# Patient Record
Sex: Male | Born: 1955 | Hispanic: No | Marital: Married | Smoking: Current every day smoker
Health system: Southern US, Community
[De-identification: ages and names within clinical notes are randomized; demographics above are authoritative.]

---

## 2009-12-05 ENCOUNTER — Emergency Department (HOSPITAL_BASED_OUTPATIENT_CLINIC_OR_DEPARTMENT_OTHER): Admission: EM | Admit: 2009-12-05 | Discharge: 2009-12-05 | Payer: Self-pay | Admitting: Emergency Medicine

## 2012-10-10 ENCOUNTER — Emergency Department (HOSPITAL_BASED_OUTPATIENT_CLINIC_OR_DEPARTMENT_OTHER): Payer: PRIVATE HEALTH INSURANCE

## 2012-10-10 ENCOUNTER — Emergency Department (HOSPITAL_BASED_OUTPATIENT_CLINIC_OR_DEPARTMENT_OTHER)
Admission: EM | Admit: 2012-10-10 | Discharge: 2012-10-10 | Disposition: A | Discharge status: Home / Self Care | Payer: PRIVATE HEALTH INSURANCE | Attending: Emergency Medicine | Admitting: Emergency Medicine

## 2012-10-10 ENCOUNTER — Encounter (HOSPITAL_BASED_OUTPATIENT_CLINIC_OR_DEPARTMENT_OTHER): Payer: Self-pay | Admitting: Emergency Medicine

## 2012-10-10 DIAGNOSIS — M79609 Pain in unspecified limb: Secondary | ICD-10-CM | POA: Insufficient documentation

## 2012-10-10 DIAGNOSIS — R739 Hyperglycemia, unspecified: Secondary | ICD-10-CM

## 2012-10-10 DIAGNOSIS — R7309 Other abnormal glucose: Secondary | ICD-10-CM | POA: Insufficient documentation

## 2012-10-10 DIAGNOSIS — F17200 Nicotine dependence, unspecified, uncomplicated: Secondary | ICD-10-CM | POA: Insufficient documentation

## 2012-10-10 DIAGNOSIS — M7989 Other specified soft tissue disorders: Secondary | ICD-10-CM | POA: Insufficient documentation

## 2012-10-10 LAB — CBC WITH DIFFERENTIAL/PLATELET
Basophils Absolute: 0 10*3/uL (ref 0.0–0.1)
Basophils Relative: 0 % (ref 0–1)
Eosinophils Absolute: 0.2 10*3/uL (ref 0.0–0.7)
Eosinophils Relative: 3 % (ref 0–5)
HCT: 38.5 % — ABNORMAL LOW (ref 39.0–52.0)
MCH: 29.1 pg (ref 26.0–34.0)
MCHC: 33.8 g/dL (ref 30.0–36.0)
MCV: 86.1 fL (ref 78.0–100.0)
Monocytes Absolute: 0.4 10*3/uL (ref 0.1–1.0)
Platelets: 270 10*3/uL (ref 150–400)
RDW: 13 % (ref 11.5–15.5)
WBC: 5.3 10*3/uL (ref 4.0–10.5)

## 2012-10-10 LAB — COMPREHENSIVE METABOLIC PANEL
ALT: 18 U/L (ref 0–53)
AST: 21 U/L (ref 0–37)
CO2: 25 mEq/L (ref 19–32)
Calcium: 9.5 mg/dL (ref 8.4–10.5)
Creatinine, Ser: 0.8 mg/dL (ref 0.50–1.35)
GFR calc non Af Amer: >90 mL/min (ref >90)
Sodium: 138 mEq/L (ref 135–145)
Total Protein: 6.9 g/dL (ref 6.0–8.3)

## 2012-10-10 MED ORDER — NAPROXEN 500 MG PO TABS: 500.0000 mg | ORAL_TABLET | Freq: Two times a day (BID) | ORAL | Status: AC

## 2012-10-10 NOTE — ED Notes (Signed)
MD at bedside. 

## 2012-10-10 NOTE — ED Notes (Signed)
Patient transported to Ultrasound 

## 2012-10-10 NOTE — ED Provider Notes (Signed)
History    This chart was scribed for Dione Booze, MD by Donne Anon, ED Scribe. This patient was seen in room MH11/MH11 and the patient's care was started at 1636.   CSN: [id number]  Arrival date & time 10/10/12  1629   First MD Initiated Contact with Patient 10/10/12 1636      Chief Complaint  Patient presents with  . Leg Pain  . Leg Swelling     The history is provided by the patient and a friend. No language interpreter was used.   HPI Comments: Jose Atkinson is a 57 y.o. male who presents to the Emergency Department complaining of 2 months of gradual onset, gradually worsening, intermittent, moderate left lower leg pain with associated swelling. He denies weigh change, CP, SOB or any other pain.  He does not have a PCP.  History reviewed. No pertinent past medical history.  History reviewed. No pertinent past surgical history.  No family history on file.  History  Substance Use Topics  . Smoking status: Current Every Day Smoker -- 1.00 packs/day  . Smokeless tobacco: Not on file  . Alcohol Use: No      Review of Systems  Constitutional: Negative for unexpected weight change.  Respiratory: Negative for shortness of breath.   Cardiovascular: Positive for leg swelling. Negative for chest pain.  Musculoskeletal: Positive for myalgias.  All other systems reviewed and are negative.    Allergies  Review of patient's allergies indicates no known allergies.  Home Medications  No current outpatient prescriptions on file.  Triage Vitals; BP 121/65  Pulse 74  Temp(Src) 99.3 F (37.4 C) (Oral)  Resp 18  SpO2 97%  Physical Exam  Nursing note and vitals reviewed. Constitutional: He is oriented to person, place, and time. He appears well-developed and well-nourished. No distress.  HENT:  Head: Normocephalic and atraumatic.  Eyes: EOM are normal.  Neck: Neck supple. No tracheal deviation present.  Cardiovascular: Normal rate, regular rhythm, normal heart sounds  and intact distal pulses.   Pulmonary/Chest: Effort normal and breath sounds normal. No respiratory distress. He has no wheezes. He has no rales.  Musculoskeletal: Normal range of motion. He exhibits no edema and no tenderness.  Left lower leg is a 5 cm x 4 cm hyperpigmented raised area on the anterior medial surface of the distal lower leg which is hard to the touch. On the anterior lateral aspect there is a vivacious area which is slightly swollen.No edema. No tenderness. Distal pulses are strong.  Neurological: He is alert and oriented to person, place, and time.  Skin: Skin is warm and dry.  Psychiatric: He has a normal mood and affect. His behavior is normal.    ED Course  Procedures (including critical care time) DIAGNOSTIC STUDIES: Oxygen Saturation is 97% on TA, adequate by my interpretation.    COORDINATION OF CARE: 4:45 PM Discussed treatment plan which includes labs and leg xray with pt at bedside and pt agreed to plan.     Results for orders placed during the hospital encounter of 10/10/12  CBC WITH DIFFERENTIAL      Result Value Range   WBC 5.3  4.0 - 10.5 K/uL   RBC 4.47  4.22 - 5.81 MIL/uL   Hemoglobin 13.0  13.0 - 17.0 g/dL   HCT 86.5 (*) 78.4 - 69.6 %   MCV 86.1  78.0 - 100.0 fL   MCH 29.1  26.0 - 34.0 pg   MCHC 33.8  30.0 - 36.0 g/dL  RDW 13.0  11.5 - 15.5 %   Platelets 270  150 - 400 K/uL   Neutrophils Relative % 54  43 - 77 %   Neutro Abs 2.9  1.7 - 7.7 K/uL   Lymphocytes Relative 36  12 - 46 %   Lymphs Abs 1.9  0.7 - 4.0 K/uL   Monocytes Relative 7  3 - 12 %   Monocytes Absolute 0.4  0.1 - 1.0 K/uL   Eosinophils Relative 3  0 - 5 %   Eosinophils Absolute 0.2  0.0 - 0.7 K/uL   Basophils Relative 0  0 - 1 %   Basophils Absolute 0.0  0.0 - 0.1 K/uL  COMPREHENSIVE METABOLIC PANEL      Result Value Range   Sodium 138  135 - 145 mEq/L   Potassium 3.6  3.5 - 5.1 mEq/L   Chloride 105  96 - 112 mEq/L   CO2 25  19 - 32 mEq/L   Glucose, Bld 175 (*) 70 - 99  mg/dL   BUN 14  6 - 23 mg/dL   Creatinine, Ser 1.61  0.50 - 1.35 mg/dL   Calcium 9.5  8.4 - 09.6 mg/dL   Total Protein 6.9  6.0 - 8.3 g/dL   Albumin 3.6  3.5 - 5.2 g/dL   AST 21  0 - 37 U/L   ALT 18  0 - 53 U/L   Alkaline Phosphatase 62  39 - 117 U/L   Total Bilirubin 0.2 (*) 0.3 - 1.2 mg/dL   GFR calc non Af Amer >90  >90 mL/min   GFR calc Af Amer >90  >90 mL/min   Dg Tibia/fibula Left  10/10/2012   *RADIOLOGY REPORT*  Clinical Data: Left leg swelling  LEFT TIBIA AND FIBULA - 2 VIEW  Comparison: None.  Findings: Four views of the left tibia-fibula submitted.  No acute fracture or subluxation.  IMPRESSION: No acute fracture or subluxation.   Original Report Authenticated By: Natasha Mead, M.D.   US Venous Img Lower Unilateral Left  10/10/2012   *RADIOLOGY REPORT*  Clinical Data: Left lower extremity pain and swelling.  LEFT LOWER EXTREMITY VENOUS DUPLEX ULTRASOUND  Technique:  Gray-scale sonography with graded compression, as well as color Doppler and duplex ultrasound, were performed to evaluate the deep venous system of the lower extremity from the level of the common femoral vein through the popliteal and proximal calf veins. Spectral Doppler was utilized to evaluate flow at rest and with distal augmentation maneuvers.  Comparison:  None  Findings: There is patent flow in the common femoral, profunda femoral, superficial femoral and popliteal veins.  These vessels were completely compressible and demonstrate increased flow with augmentation.  IMPRESSION: Negative venous Doppler examination for deep venous thrombosis.   Original Report Authenticated By: Rudie Meyer, M.D.      1. Left leg swelling   2. Hyperglycemia       MDM  Left leg swelling of uncertain cause. X-ray will be obtained as well as screening labs.  Screening labs are significant only for hyperglycemia. X-rays are unremarkable. Ultrasound will be obtained.  Ultrasound was also unremarkable. He'll be discharged with a  prescription for naproxen and is referred to Dr.Hudnall for further evaluation as an outpatient.    I personally performed the services described in this documentation, which was scribed in my presence. The recorded information has been reviewed and is accurate.        Dione Booze, MD 10/10/12 [phone]

## 2012-10-10 NOTE — ED Notes (Signed)
Left lower leg pain and swelling x2 months.

## 2013-10-26 IMAGING — CR DG TIBIA/FIBULA 2V*L*
4 series · 4 of 4 positions shown · non-contrast
Comparison: None.

CLINICAL DATA: Left leg swelling

LEFT TIBIA AND FIBULA - 2 VIEW

[t tib/fib ap left (1 of 2)]
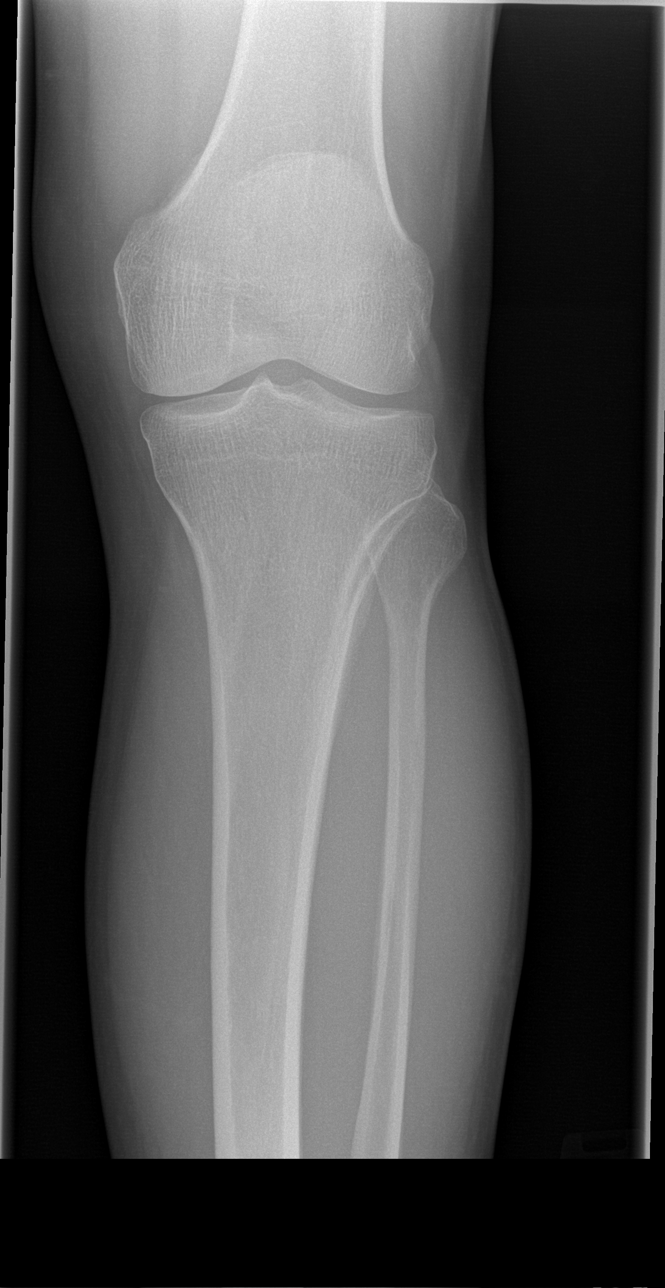

[t tib/fib ap left (2 of 2)]
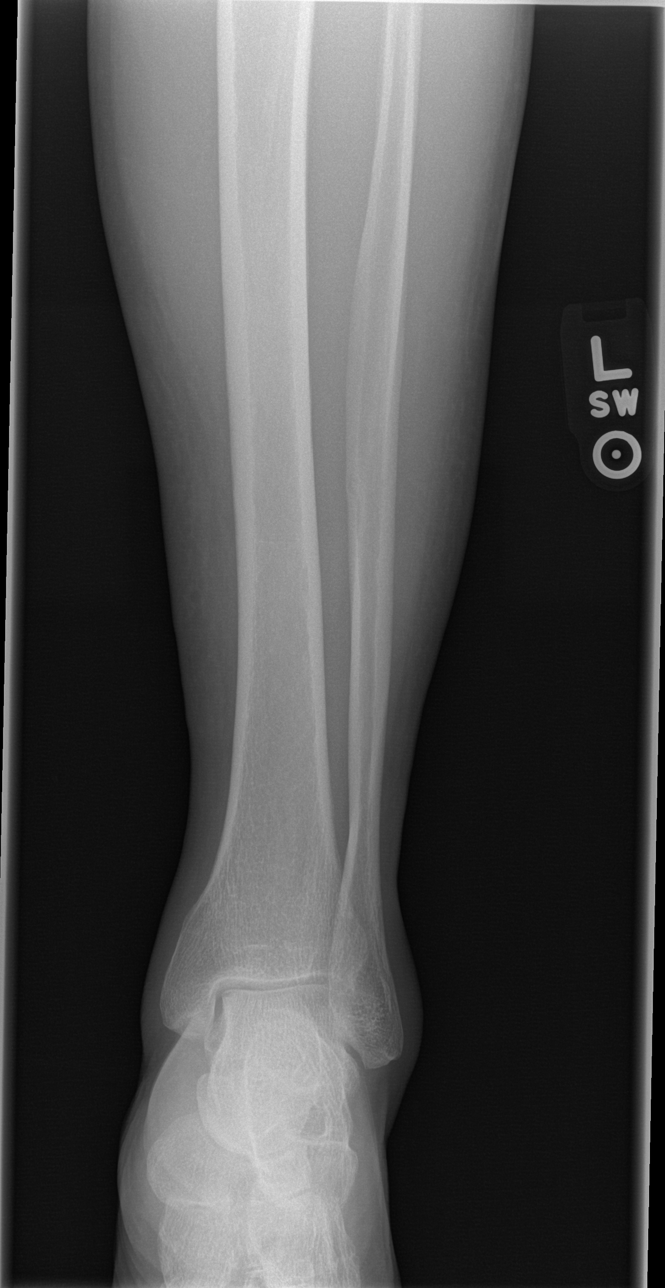

[t tib/fib lat left (1 of 2)]
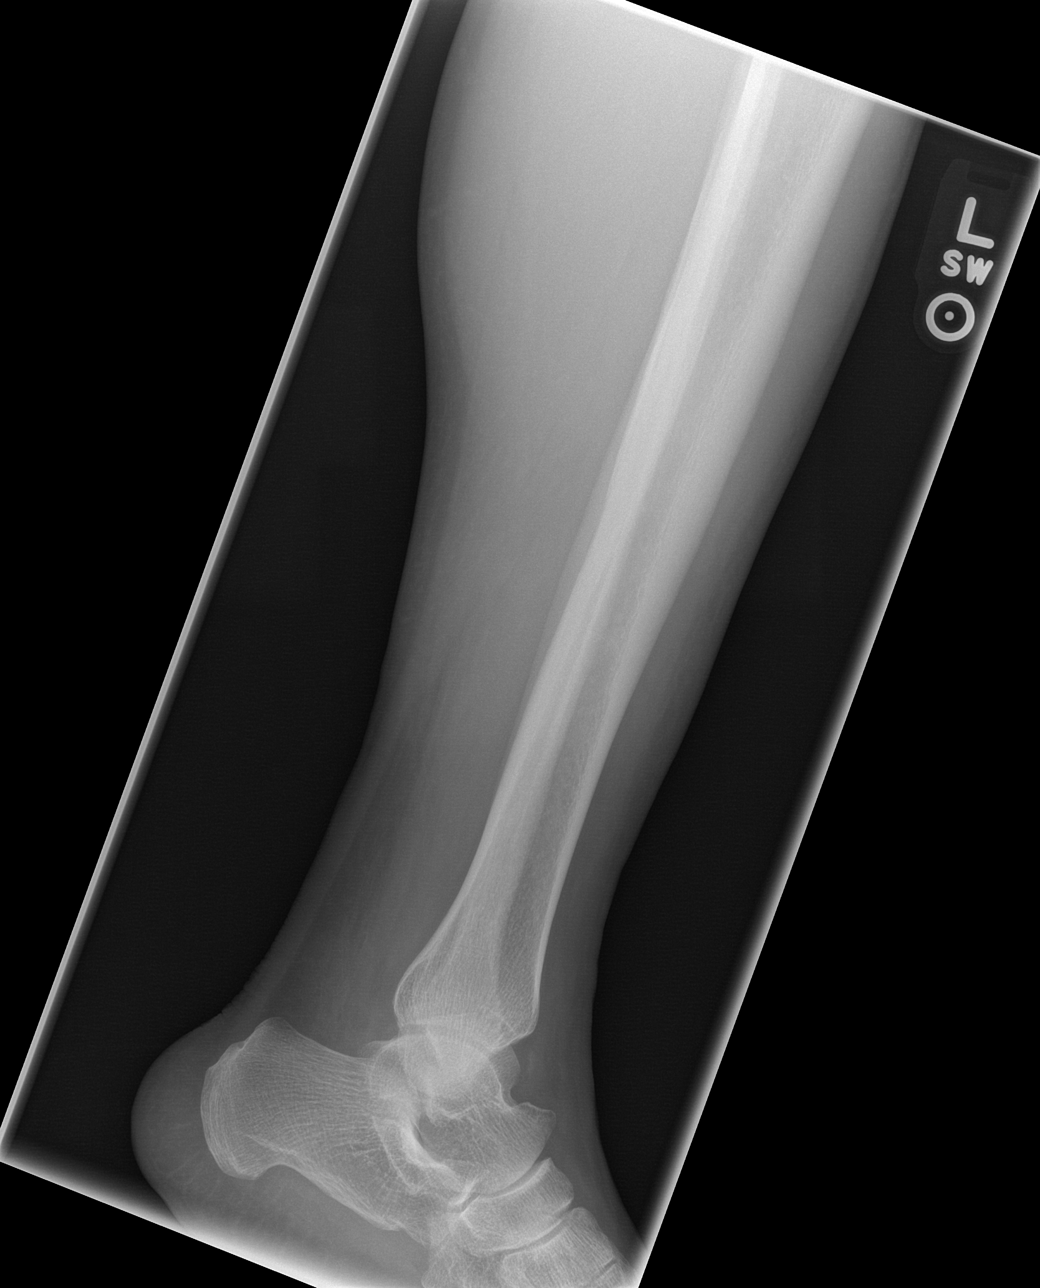

[t tib/fib lat left (2 of 2)]
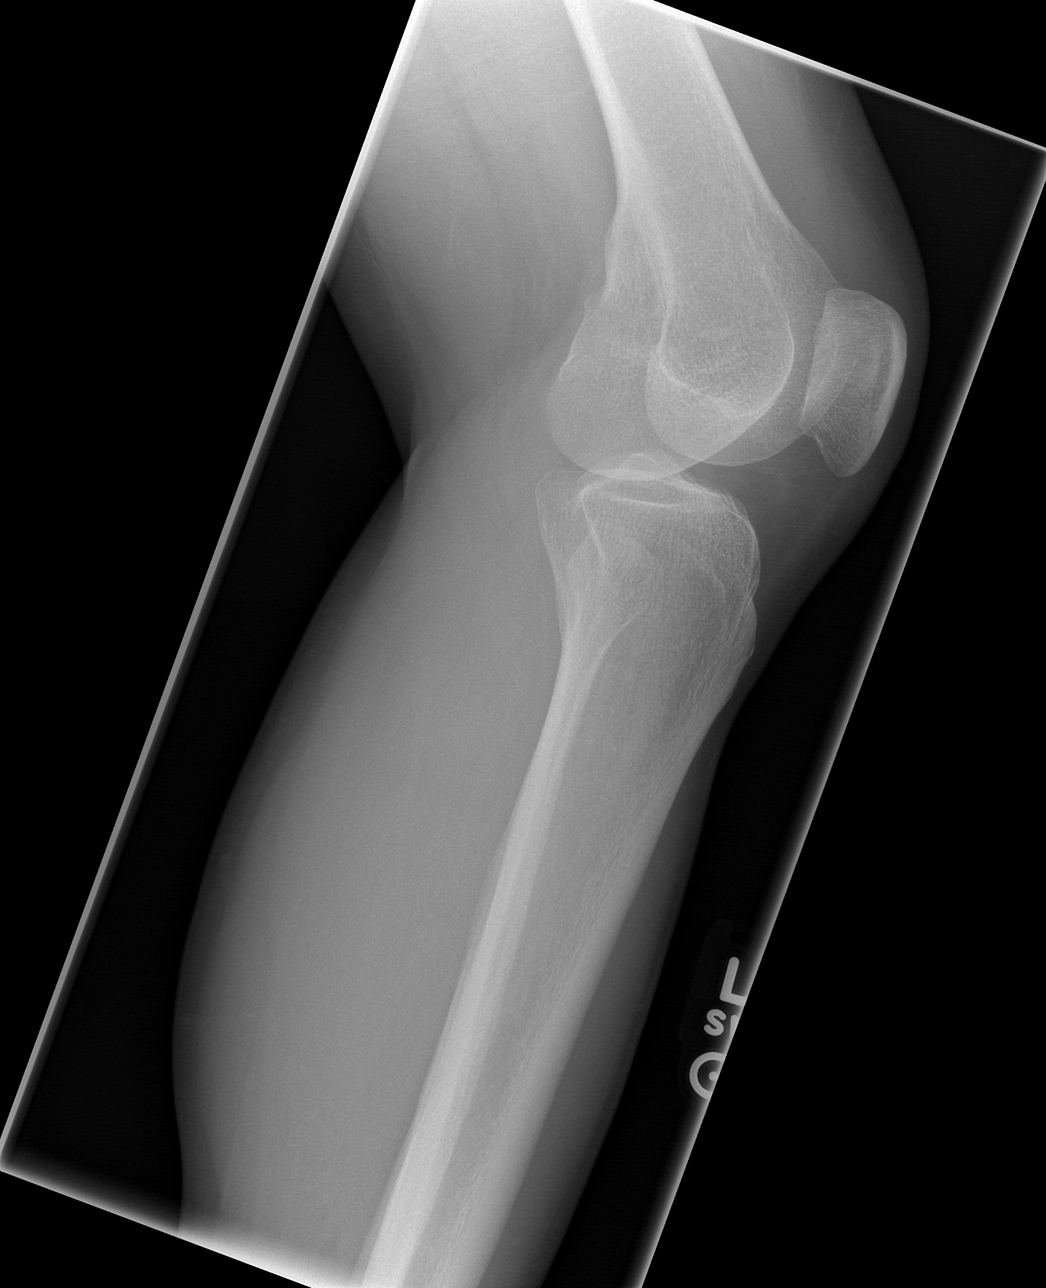

[4 of 4 positions shown; findings below may reference images not displayed]

FINDINGS: Four views of the left tibia-fibula submitted.  No acute
fracture or subluxation.
IMPRESSION: No acute fracture or subluxation.

## 2018-02-26 ENCOUNTER — Emergency Department (HOSPITAL_BASED_OUTPATIENT_CLINIC_OR_DEPARTMENT_OTHER): Payer: BLUE CROSS/BLUE SHIELD

## 2018-02-26 ENCOUNTER — Other Ambulatory Visit: Payer: Self-pay

## 2018-02-26 ENCOUNTER — Encounter (HOSPITAL_BASED_OUTPATIENT_CLINIC_OR_DEPARTMENT_OTHER): Payer: Self-pay | Admitting: Emergency Medicine

## 2018-02-26 ENCOUNTER — Emergency Department (HOSPITAL_BASED_OUTPATIENT_CLINIC_OR_DEPARTMENT_OTHER)
Admission: EM | Admit: 2018-02-26 | Discharge: 2018-02-26 | Disposition: A | Discharge status: Home / Self Care | Payer: BLUE CROSS/BLUE SHIELD | Attending: Emergency Medicine | Admitting: Emergency Medicine

## 2018-02-26 DIAGNOSIS — Y999 Unspecified external cause status: Secondary | ICD-10-CM | POA: Insufficient documentation

## 2018-02-26 DIAGNOSIS — Y939 Activity, unspecified: Secondary | ICD-10-CM | POA: Insufficient documentation

## 2018-02-26 DIAGNOSIS — Y929 Unspecified place or not applicable: Secondary | ICD-10-CM | POA: Insufficient documentation

## 2018-02-26 DIAGNOSIS — Z23 Encounter for immunization: Secondary | ICD-10-CM | POA: Insufficient documentation

## 2018-02-26 DIAGNOSIS — S61215A Laceration without foreign body of left ring finger without damage to nail, initial encounter: Secondary | ICD-10-CM | POA: Insufficient documentation

## 2018-02-26 DIAGNOSIS — Y93G1 Activity, food preparation and clean up: Secondary | ICD-10-CM | POA: Insufficient documentation

## 2018-02-26 DIAGNOSIS — W312XXA Contact with powered woodworking and forming machines, initial encounter: Secondary | ICD-10-CM | POA: Insufficient documentation

## 2018-02-26 MED ORDER — CEPHALEXIN 500 MG PO CAPS: 500.0000 mg | ORAL_CAPSULE | Freq: Four times a day (QID) | ORAL | 0 refills | Status: AC

## 2018-02-26 MED ORDER — LIDOCAINE HCL 2 % IJ SOLN
20.0000 mL | Freq: Once | INTRAMUSCULAR | Status: AC
  Administered 2018-02-26: 400 mg via INTRADERMAL
  Filled 2018-02-26: qty 20

## 2018-02-26 MED ORDER — TETANUS-DIPHTH-ACELL PERTUSSIS 5-2.5-18.5 LF-MCG/0.5 IM SUSP
0.5000 mL | Freq: Once | INTRAMUSCULAR | Status: AC
  Administered 2018-02-26: 0.5 mL via INTRAMUSCULAR
  Filled 2018-02-26: qty 0.5

## 2018-02-26 NOTE — ED Provider Notes (Signed)
[facility] EMERGENCY DEPARTMENT Provider Note   CSN: [id number] Arrival date & time: 02/26/18  1814     History   Chief Complaint Chief Complaint  Patient presents with  . Extremity Laceration    HPI Jose Atkinson is a 62 y.o. male who presents with a left ring finger laceration. No significant PMH. Son is at bedside and translates as the patient does not speak [place] well (Urdu and Hindi). He was cutting meat on a bandsaw and slipped and cut the tip of his left ring finger with nail involvement. 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 Tetanus is not up to date  HPI  History reviewed. No pertinent past medical history.  There are no active problems to display for this patient.   History reviewed. No pertinent surgical history.      Home Medications    Prior to Admission medications   Medication Sig Start Date End Date Taking? Authorizing Provider  naproxen (NAPROSYN) 500 MG tablet Take 1 tablet (500 mg total) by mouth 2 (two) times daily. 10/10/12   Dione Booze, MD    Family History History reviewed. No pertinent family history.  Social History Social History   Tobacco Use  . Smoking status: Current Every Day Smoker    Packs/day: 1.00  . Smokeless tobacco: Never Used  Substance Use Topics  . Alcohol use: No  . Drug use: No     Allergies   Patient has no known allergies.   Review of Systems Review of Systems  Musculoskeletal: Positive for arthralgias.  Skin: Positive for wound.  Neurological: Negative for weakness and numbness.  All other systems reviewed and are negative.    Physical Exam Updated Vital Signs BP (!) 151/78 (BP Location: Left Arm)   Pulse 83   Temp 98.3 F (36.8 C) (Oral)   Resp 18   Ht 6' (1.829 m)   Wt 82 kg   SpO2 100%   BMI 24.52 kg/m   Physical Exam  Constitutional: He is oriented to person, place, and time. He appears well-developed and well-nourished. No distress.  HENT:  Head: Normocephalic  and atraumatic.  Eyes: Pupils are equal, round, and reactive to light. Conjunctivae are normal. Right eye exhibits no discharge. Left eye exhibits no discharge. No scleral icterus.  Neck: Normal range of motion.  Cardiovascular: Normal rate.  Pulmonary/Chest: Effort normal. No respiratory distress.  Abdominal: He exhibits no distension.  Musculoskeletal:  Left ring finger: Deep laceration of the distal ring finger under the nail with partial avulsion of the finger pad. Oozing blood. Able to flex at DIP and PIP  Neurological: He is alert and oriented to person, place, and time.  Skin: Skin is warm and dry.  Psychiatric: He has a normal mood and affect. His behavior is normal.  Nursing note and vitals reviewed.    ED Treatments / Results  Labs (all labs ordered are listed, but only abnormal results are displayed) Labs Reviewed - No data to display  EKG None  Radiology Dg Finger Ring Left  Result Date: 02/26/2018 CLINICAL DATA:  Laceration to left ring finger. EXAM: LEFT RING FINGER 2+V COMPARISON:  None. FINDINGS: Small avulsed fragments suspected at the tip of the distal phalanx of the left ring finger, seen only on the lateral view. No additional acute bony abnormality. No subluxation or dislocation. IMPRESSION: Suspect small avulsed fragment off the tip of the distal phalanx seen on the on the lateral view. Electronically Signed   By:  Charlett Nose M.D.   On: 02/26/2018 20:11    Procedures .Marland KitchenLaceration Repair Date/Time: 02/26/2018 11:30 PM Performed by: Bethel Born, PA-C Authorized by: Bethel Born, PA-C   Consent:    Consent obtained:  Verbal   Consent given by:  Patient   Risks discussed:  Infection and pain Anesthesia (see MAR for exact dosages):    Anesthesia method:  Nerve block   Block location:  Ring finger   Block needle gauge:  25 G   Block anesthetic:  Lidocaine 2% w/o epi   Block injection procedure:  Anatomic landmarks identified, introduced  needle, incremental injection, anatomic landmarks palpated and negative aspiration for blood   Block outcome:  Anesthesia achieved Laceration details:    Location:  Finger   Finger location:  L ring finger   Length (cm):  3   Depth (mm):  10 Repair type:    Repair type:  Intermediate Pre-procedure details:    Preparation:  Patient was prepped and draped in usual sterile fashion Exploration:    Hemostasis achieved with:  Direct pressure   Wound exploration: wound explored through full range of motion and entire depth of wound probed and visualized     Wound extent: underlying fracture     Wound extent: no nerve damage noted, no tendon damage noted and no vascular damage noted     Contaminated: no   Treatment:    Area cleansed with:  Soap and water   Amount of cleaning:  Extensive   Irrigation solution:  Tap water   Irrigation volume:  100cc   Irrigation method:  Tap   Visualized foreign bodies/material removed: no   Skin repair:    Repair method:  Sutures   Suture size:  5-0   Suture material:  Prolene   Suture technique:  Simple interrupted   Number of sutures:  10 Approximation:    Approximation:  Close Post-procedure details:    Dressing:  Antibiotic ointment and sterile dressing   Patient tolerance of procedure:  Tolerated well, no immediate complications   (including critical care time)   Medications Ordered in ED Medications  Tdap (BOOSTRIX) injection 0.5 mL (0.5 mLs Intramuscular Given 02/26/18 2008)  lidocaine (XYLOCAINE) 2 % (with pres) injection 400 mg (400 mg Intradermal Given by Other 02/26/18 2045)     Initial Impression / Assessment and Plan / ED Course  I have reviewed the triage vital signs and the nursing notes.  Pertinent labs & imaging results that were available during my care of the patient were reviewed by me and considered in my medical decision making (see chart for details).  62 year old male presents with left ring finger laceration. Xray  shows possible avulsion fragment of the distal phalanx. Shared visit with Dr. Jodi Mourning. Wound was extensively irrigated and repaired in the ED. Bottom of the wound visualized and bleeding controlled. 10 prolene sutures placed. Wound care discussed and advised to return to have stitches removed in 7-10 days. Tdap was updated. Return precautions discussed.   Final Clinical Impressions(s) / ED Diagnoses   Final diagnoses:  Laceration of left ring finger without foreign body without damage to nail, initial encounter    ED Discharge Orders    None       Bethel Born, PA-C 02/26/18 2332    Blane Ohara, MD 02/28/18 1040

## 2018-02-26 NOTE — ED Triage Notes (Signed)
Patient states that he was cutting meat and cut his left ring finger

## 2018-02-26 NOTE — Discharge Instructions (Addendum)
Keep area clean by washing with soap and water daily. Pat dry, do not scrub the stitches Apply a bandage at least once daily, change more often if it is dirty. Keep covered at all times Watch for signs of infection (redness, drainage, worsening pain) Take Keflex for the next 5 days Have stitches removed in 7-10 days. There are 10 total

## 2018-02-26 NOTE — ED Notes (Signed)
ED Provider at bedside suturing.  

## 2018-02-26 NOTE — ED Notes (Signed)
ED Provider at bedside. 

## 2018-02-26 NOTE — ED Notes (Signed)
Xray present at bedside.

## 2018-03-12 ENCOUNTER — Emergency Department (HOSPITAL_BASED_OUTPATIENT_CLINIC_OR_DEPARTMENT_OTHER)
Admission: EM | Admit: 2018-03-12 | Discharge: 2018-03-12 | Disposition: A | Discharge status: Home / Self Care | Payer: BLUE CROSS/BLUE SHIELD | Attending: Emergency Medicine | Admitting: Emergency Medicine

## 2018-03-12 ENCOUNTER — Encounter (HOSPITAL_BASED_OUTPATIENT_CLINIC_OR_DEPARTMENT_OTHER): Payer: Self-pay | Admitting: Emergency Medicine

## 2018-03-12 ENCOUNTER — Other Ambulatory Visit: Payer: Self-pay

## 2018-03-12 DIAGNOSIS — Z5321 Procedure and treatment not carried out due to patient leaving prior to being seen by health care provider: Secondary | ICD-10-CM | POA: Diagnosis not present

## 2018-03-12 DIAGNOSIS — S61215D Laceration without foreign body of left ring finger without damage to nail, subsequent encounter: Secondary | ICD-10-CM | POA: Diagnosis present

## 2018-03-12 DIAGNOSIS — Y33XXXD Other specified events, undetermined intent, subsequent encounter: Secondary | ICD-10-CM | POA: Diagnosis not present

## 2018-03-12 NOTE — ED Triage Notes (Signed)
PT was seen here and stitches placed on L ring finger on 02/26/18. Pt is here to have them removed today. Of note, fingertip appears white/gray.

## 2018-03-12 NOTE — ED Notes (Signed)
RN went to check on patient after report that he came out and slammed the door. Pt was upset about the wait time. RN tried to explain about volume and acuity and that the provider is making their way around. He kept saying that all I need is stitches out and that they have waited 4 hours. RN acknowledged and they decided they wanted to leave.

## 2018-03-12 NOTE — ED Notes (Signed)
Pt. Opened the exam door to ask secretary what was taking so long for him to be seen. Before we could respond, pt. Slammed the door and went by in his exam room. Primary RN in to speak with pt.

## 2019-03-14 IMAGING — DX DG FINGER RING 2+V*L*
3 series · 3 of 3 positions shown · non-contrast
Comparison: None.

CLINICAL DATA: Laceration to left ring finger.

EXAM:
LEFT RING FINGER 2+V

[finger ap]
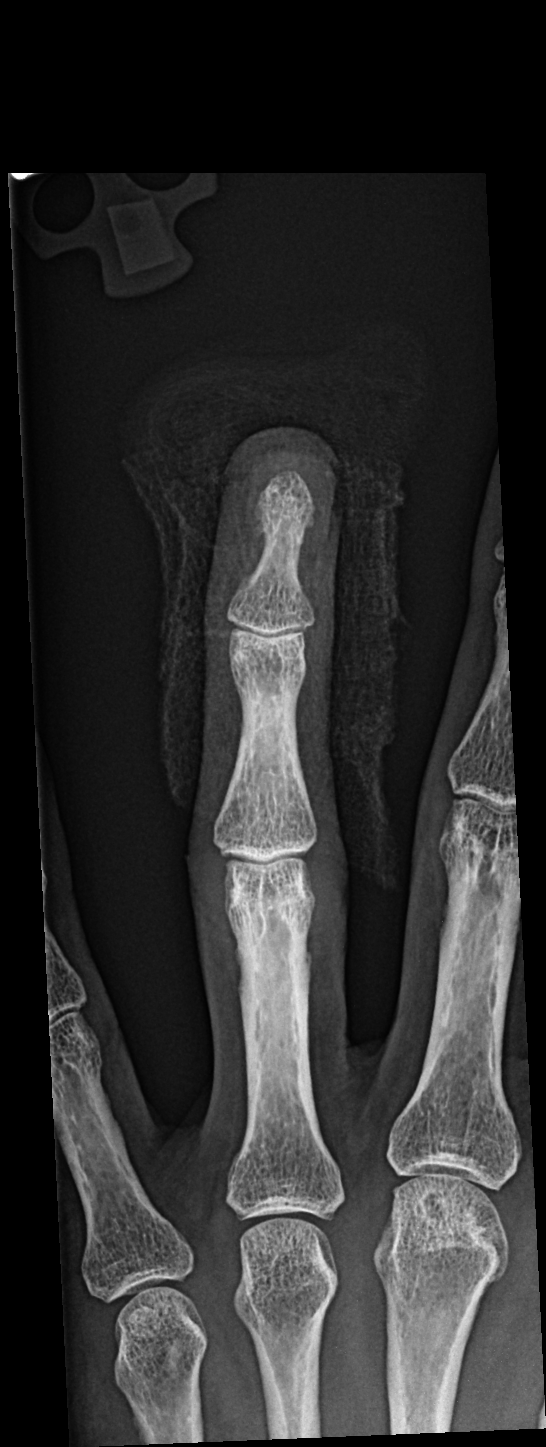

[finger obl]
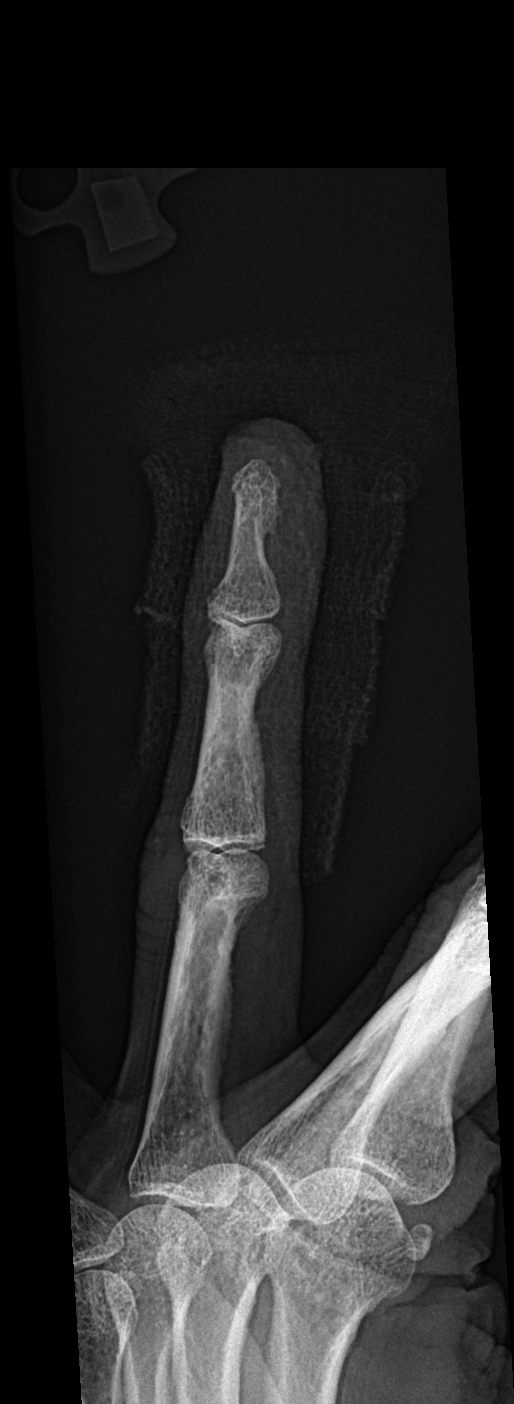

[finger lat]
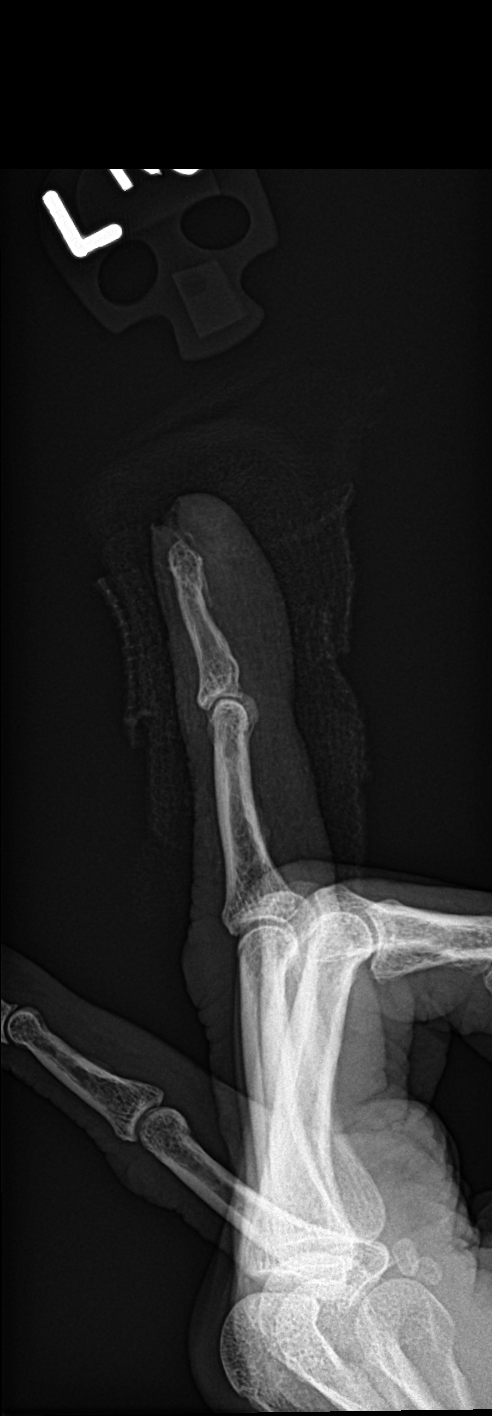

[3 of 3 positions shown; findings below may reference images not displayed]

FINDINGS: Small avulsed fragments suspected at the tip of the distal phalanx
of the left ring finger, seen only on the lateral view. No
additional acute bony abnormality. No subluxation or dislocation.
IMPRESSION: Suspect small avulsed fragment off the tip of the distal phalanx
seen on the on the lateral view.
